# Patient Record
Sex: Male | Born: 1985 | Race: White | Hispanic: No | Marital: Single | Smoking: Former smoker
Health system: Southern US, Community
[De-identification: ages and names within clinical notes are randomized; demographics above are authoritative.]

## PROBLEM LIST (undated history)

## (undated) ENCOUNTER — Emergency Department (HOSPITAL_COMMUNITY): Admission: EM | Disposition: A | Discharge status: Home / Self Care | Payer: Medicare Other

## (undated) DIAGNOSIS — G40909 Epilepsy, unspecified, not intractable, without status epilepticus: Secondary | ICD-10-CM

---
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
## 2004-05-25 ENCOUNTER — Ambulatory Visit (HOSPITAL_COMMUNITY): Payer: Self-pay | Admitting: Psychiatry

## 2004-06-30 ENCOUNTER — Ambulatory Visit (HOSPITAL_COMMUNITY): Payer: Self-pay | Admitting: Psychiatry

## 2004-10-19 ENCOUNTER — Ambulatory Visit (HOSPITAL_COMMUNITY): Payer: Self-pay | Admitting: Psychiatry

## 2005-01-01 ENCOUNTER — Ambulatory Visit (HOSPITAL_COMMUNITY): Payer: Self-pay | Admitting: Psychiatry

## 2005-03-28 ENCOUNTER — Ambulatory Visit (HOSPITAL_COMMUNITY): Payer: Self-pay | Admitting: Psychiatry

## 2005-07-16 ENCOUNTER — Ambulatory Visit (HOSPITAL_COMMUNITY): Payer: Self-pay | Admitting: Psychiatry

## 2005-10-16 ENCOUNTER — Ambulatory Visit (HOSPITAL_COMMUNITY): Payer: Self-pay | Admitting: Psychiatry

## 2006-01-07 ENCOUNTER — Ambulatory Visit (HOSPITAL_COMMUNITY): Payer: Self-pay | Admitting: Psychiatry

## 2006-04-10 ENCOUNTER — Ambulatory Visit (HOSPITAL_COMMUNITY): Payer: Self-pay | Admitting: Psychiatry

## 2006-08-23 ENCOUNTER — Ambulatory Visit (HOSPITAL_COMMUNITY): Payer: Self-pay | Admitting: Psychiatry

## 2006-11-25 ENCOUNTER — Ambulatory Visit (HOSPITAL_COMMUNITY): Payer: Self-pay | Admitting: Psychiatry

## 2007-06-16 ENCOUNTER — Emergency Department (HOSPITAL_COMMUNITY): Admission: EM | Admit: 2007-06-16 | Discharge: 2007-06-16 | Payer: Self-pay | Admitting: Emergency Medicine

## 2007-08-24 ENCOUNTER — Emergency Department (HOSPITAL_COMMUNITY): Admission: EM | Admit: 2007-08-24 | Discharge: 2007-08-24 | Payer: Self-pay | Admitting: Emergency Medicine

## 2009-05-21 ENCOUNTER — Emergency Department (HOSPITAL_COMMUNITY): Admission: EM | Admit: 2009-05-21 | Discharge: 2009-05-21 | Payer: Self-pay | Admitting: Emergency Medicine

## 2009-11-05 ENCOUNTER — Emergency Department (HOSPITAL_COMMUNITY): Admission: EM | Admit: 2009-11-05 | Discharge: 2009-11-06 | Payer: Self-pay | Admitting: Emergency Medicine

## 2010-12-22 LAB — URINALYSIS, ROUTINE W REFLEX MICROSCOPIC
Ketones, ur: NEGATIVE mg/dL
Nitrite: NEGATIVE
Specific Gravity, Urine: 1.007 (ref 1.005–1.030)

## 2010-12-22 LAB — DIFFERENTIAL
Basophils Relative: 1 % (ref 0–1)
Eosinophils Absolute: 0.1 10*3/uL (ref 0.0–0.7)
Eosinophils Relative: 1 % (ref 0–5)
Monocytes Absolute: 0.5 10*3/uL (ref 0.1–1.0)
Monocytes Relative: 6 % (ref 3–12)
Neutrophils Relative %: 64 % (ref 43–77)

## 2010-12-22 LAB — BASIC METABOLIC PANEL
Creatinine, Ser: 0.76 mg/dL (ref 0.4–1.5)
GFR calc non Af Amer: >60 mL/min (ref >60)
Glucose, Bld: 118 mg/dL — ABNORMAL HIGH (ref 70–99)
Sodium: 141 mEq/L (ref 135–145)

## 2010-12-22 LAB — RAPID URINE DRUG SCREEN, HOSP PERFORMED
Cocaine: NOT DETECTED
Opiates: NOT DETECTED

## 2010-12-22 LAB — CBC
HCT: 44.9 % (ref 39.0–52.0)
Platelets: 267 10*3/uL (ref 150–400)
RDW: 12.1 % (ref 11.5–15.5)
WBC: 8.4 10*3/uL (ref 4.0–10.5)

## 2010-12-22 LAB — GLUCOSE, CAPILLARY

## 2011-06-25 LAB — BASIC METABOLIC PANEL
CO2: 32
Calcium: 10.1
Creatinine, Ser: 0.79
GFR calc non Af Amer: >60
Glucose, Bld: 125 — ABNORMAL HIGH

## 2011-06-25 LAB — DIFFERENTIAL
Basophils Relative: 1
Lymphocytes Relative: 37
Lymphs Abs: 2.7
Monocytes Relative: 8
Neutrophils Relative %: 54

## 2011-06-25 LAB — CBC
Platelets: 251
RDW: 12.9

## 2011-06-28 LAB — RAPID URINE DRUG SCREEN, HOSP PERFORMED
Amphetamines: NOT DETECTED
Benzodiazepines: NOT DETECTED
Cocaine: NOT DETECTED
Opiates: NOT DETECTED

## 2011-06-28 LAB — BASIC METABOLIC PANEL
Calcium: 10.1
Creatinine, Ser: 0.89
GFR calc Af Amer: >60
GFR calc non Af Amer: >60
Glucose, Bld: 84

## 2011-06-28 LAB — CBC
Hemoglobin: 15.6
MCHC: 34.3
RDW: 13.1

## 2011-06-28 LAB — URINALYSIS, ROUTINE W REFLEX MICROSCOPIC
Bilirubin Urine: NEGATIVE
Hgb urine dipstick: NEGATIVE
Nitrite: NEGATIVE
Specific Gravity, Urine: 1.01
pH: 7.5

## 2011-06-28 LAB — VALPROIC ACID LEVEL: Valproic Acid Lvl: 96.5

## 2011-06-28 LAB — DIFFERENTIAL
Lymphocytes Relative: 47 — ABNORMAL HIGH
Lymphs Abs: 3.1
Neutro Abs: 2.8
Neutrophils Relative %: 44

## 2014-09-24 DIAGNOSIS — G40209 Localization-related (focal) (partial) symptomatic epilepsy and epileptic syndromes with complex partial seizures, not intractable, without status epilepticus: Secondary | ICD-10-CM | POA: Diagnosis not present

## 2014-09-24 DIAGNOSIS — F79 Unspecified intellectual disabilities: Secondary | ICD-10-CM | POA: Diagnosis not present

## 2014-09-24 DIAGNOSIS — G919 Hydrocephalus, unspecified: Secondary | ICD-10-CM | POA: Diagnosis not present

## 2014-09-29 DIAGNOSIS — M7022 Olecranon bursitis, left elbow: Secondary | ICD-10-CM | POA: Diagnosis not present

## 2014-09-29 DIAGNOSIS — Z79899 Other long term (current) drug therapy: Secondary | ICD-10-CM | POA: Diagnosis not present

## 2014-09-29 DIAGNOSIS — Z6829 Body mass index (BMI) 29.0-29.9, adult: Secondary | ICD-10-CM | POA: Diagnosis not present

## 2015-01-05 DIAGNOSIS — Z79899 Other long term (current) drug therapy: Secondary | ICD-10-CM | POA: Diagnosis not present

## 2015-03-29 DIAGNOSIS — G40209 Localization-related (focal) (partial) symptomatic epilepsy and epileptic syndromes with complex partial seizures, not intractable, without status epilepticus: Secondary | ICD-10-CM | POA: Diagnosis not present

## 2015-04-01 DIAGNOSIS — F79 Unspecified intellectual disabilities: Secondary | ICD-10-CM | POA: Diagnosis not present

## 2015-04-01 DIAGNOSIS — G40219 Localization-related (focal) (partial) symptomatic epilepsy and epileptic syndromes with complex partial seizures, intractable, without status epilepticus: Secondary | ICD-10-CM | POA: Diagnosis not present

## 2015-04-20 DIAGNOSIS — S39012A Strain of muscle, fascia and tendon of lower back, initial encounter: Secondary | ICD-10-CM | POA: Diagnosis not present

## 2015-06-13 DIAGNOSIS — G40909 Epilepsy, unspecified, not intractable, without status epilepticus: Secondary | ICD-10-CM | POA: Diagnosis not present

## 2015-07-05 DIAGNOSIS — G44311 Acute post-traumatic headache, intractable: Secondary | ICD-10-CM | POA: Diagnosis not present

## 2015-07-05 DIAGNOSIS — W1839XA Other fall on same level, initial encounter: Secondary | ICD-10-CM | POA: Diagnosis not present

## 2015-07-05 DIAGNOSIS — R569 Unspecified convulsions: Secondary | ICD-10-CM | POA: Diagnosis not present

## 2015-07-05 DIAGNOSIS — Z79899 Other long term (current) drug therapy: Secondary | ICD-10-CM | POA: Diagnosis not present

## 2015-07-05 DIAGNOSIS — Q031 Atresia of foramina of Magendie and Luschka: Secondary | ICD-10-CM | POA: Diagnosis not present

## 2015-07-05 DIAGNOSIS — S0101XA Laceration without foreign body of scalp, initial encounter: Secondary | ICD-10-CM | POA: Diagnosis not present

## 2015-07-05 DIAGNOSIS — Z7984 Long term (current) use of oral hypoglycemic drugs: Secondary | ICD-10-CM | POA: Diagnosis not present

## 2015-07-05 DIAGNOSIS — R220 Localized swelling, mass and lump, head: Secondary | ICD-10-CM | POA: Diagnosis not present

## 2015-07-05 DIAGNOSIS — Z87891 Personal history of nicotine dependence: Secondary | ICD-10-CM | POA: Diagnosis not present

## 2015-07-15 DIAGNOSIS — T148 Other injury of unspecified body region: Secondary | ICD-10-CM | POA: Diagnosis not present

## 2015-07-15 DIAGNOSIS — Z23 Encounter for immunization: Secondary | ICD-10-CM | POA: Diagnosis not present

## 2015-07-15 DIAGNOSIS — F329 Major depressive disorder, single episode, unspecified: Secondary | ICD-10-CM | POA: Diagnosis not present

## 2015-07-15 DIAGNOSIS — Z79899 Other long term (current) drug therapy: Secondary | ICD-10-CM | POA: Diagnosis not present

## 2015-07-15 DIAGNOSIS — Z79891 Long term (current) use of opiate analgesic: Secondary | ICD-10-CM | POA: Diagnosis not present

## 2015-07-15 DIAGNOSIS — G40909 Epilepsy, unspecified, not intractable, without status epilepticus: Secondary | ICD-10-CM | POA: Diagnosis not present

## 2015-07-15 DIAGNOSIS — Z0000 Encounter for general adult medical examination without abnormal findings: Secondary | ICD-10-CM | POA: Diagnosis not present

## 2015-07-15 DIAGNOSIS — F79 Unspecified intellectual disabilities: Secondary | ICD-10-CM | POA: Diagnosis not present

## 2015-09-26 DIAGNOSIS — G40219 Localization-related (focal) (partial) symptomatic epilepsy and epileptic syndromes with complex partial seizures, intractable, without status epilepticus: Secondary | ICD-10-CM | POA: Diagnosis not present

## 2015-09-26 DIAGNOSIS — F79 Unspecified intellectual disabilities: Secondary | ICD-10-CM | POA: Diagnosis not present

## 2015-10-07 DIAGNOSIS — F79 Unspecified intellectual disabilities: Secondary | ICD-10-CM | POA: Diagnosis not present

## 2015-10-07 DIAGNOSIS — G40219 Localization-related (focal) (partial) symptomatic epilepsy and epileptic syndromes with complex partial seizures, intractable, without status epilepticus: Secondary | ICD-10-CM | POA: Diagnosis not present

## 2015-10-07 DIAGNOSIS — G919 Hydrocephalus, unspecified: Secondary | ICD-10-CM | POA: Diagnosis not present

## 2015-10-26 DIAGNOSIS — F840 Autistic disorder: Secondary | ICD-10-CM | POA: Diagnosis not present

## 2016-01-09 DIAGNOSIS — F840 Autistic disorder: Secondary | ICD-10-CM | POA: Diagnosis not present

## 2016-02-10 DIAGNOSIS — G919 Hydrocephalus, unspecified: Secondary | ICD-10-CM | POA: Diagnosis not present

## 2016-02-10 DIAGNOSIS — G40219 Localization-related (focal) (partial) symptomatic epilepsy and epileptic syndromes with complex partial seizures, intractable, without status epilepticus: Secondary | ICD-10-CM | POA: Diagnosis not present

## 2016-02-10 DIAGNOSIS — F79 Unspecified intellectual disabilities: Secondary | ICD-10-CM | POA: Diagnosis not present

## 2016-02-14 DIAGNOSIS — Z79899 Other long term (current) drug therapy: Secondary | ICD-10-CM | POA: Diagnosis not present

## 2016-02-14 DIAGNOSIS — R569 Unspecified convulsions: Secondary | ICD-10-CM | POA: Diagnosis not present

## 2016-02-14 DIAGNOSIS — W010XXA Fall on same level from slipping, tripping and stumbling without subsequent striking against object, initial encounter: Secondary | ICD-10-CM | POA: Diagnosis not present

## 2016-02-14 DIAGNOSIS — Z87891 Personal history of nicotine dependence: Secondary | ICD-10-CM | POA: Diagnosis not present

## 2016-02-14 DIAGNOSIS — F840 Autistic disorder: Secondary | ICD-10-CM | POA: Diagnosis not present

## 2016-02-14 DIAGNOSIS — S0121XA Laceration without foreign body of nose, initial encounter: Secondary | ICD-10-CM | POA: Diagnosis not present

## 2016-02-14 DIAGNOSIS — Z7984 Long term (current) use of oral hypoglycemic drugs: Secondary | ICD-10-CM | POA: Diagnosis not present

## 2016-03-21 DIAGNOSIS — R7303 Prediabetes: Secondary | ICD-10-CM | POA: Diagnosis not present

## 2016-04-10 DIAGNOSIS — F840 Autistic disorder: Secondary | ICD-10-CM | POA: Diagnosis not present

## 2016-04-13 DIAGNOSIS — R899 Unspecified abnormal finding in specimens from other organs, systems and tissues: Secondary | ICD-10-CM | POA: Diagnosis not present

## 2016-06-20 DIAGNOSIS — Z87891 Personal history of nicotine dependence: Secondary | ICD-10-CM | POA: Diagnosis not present

## 2016-06-20 DIAGNOSIS — Z7984 Long term (current) use of oral hypoglycemic drugs: Secondary | ICD-10-CM | POA: Diagnosis not present

## 2016-06-20 DIAGNOSIS — R569 Unspecified convulsions: Secondary | ICD-10-CM | POA: Diagnosis not present

## 2016-06-20 DIAGNOSIS — W182XXA Fall in (into) shower or empty bathtub, initial encounter: Secondary | ICD-10-CM | POA: Diagnosis not present

## 2016-06-20 DIAGNOSIS — Z79899 Other long term (current) drug therapy: Secondary | ICD-10-CM | POA: Diagnosis not present

## 2016-06-20 DIAGNOSIS — S0101XA Laceration without foreign body of scalp, initial encounter: Secondary | ICD-10-CM | POA: Diagnosis not present

## 2016-06-20 DIAGNOSIS — S0990XA Unspecified injury of head, initial encounter: Secondary | ICD-10-CM | POA: Diagnosis not present

## 2016-07-19 DIAGNOSIS — R7303 Prediabetes: Secondary | ICD-10-CM | POA: Diagnosis not present

## 2016-08-06 DIAGNOSIS — G919 Hydrocephalus, unspecified: Secondary | ICD-10-CM | POA: Diagnosis not present

## 2016-09-14 DIAGNOSIS — Z7984 Long term (current) use of oral hypoglycemic drugs: Secondary | ICD-10-CM | POA: Diagnosis not present

## 2016-09-14 DIAGNOSIS — Q039 Congenital hydrocephalus, unspecified: Secondary | ICD-10-CM | POA: Diagnosis not present

## 2016-09-14 DIAGNOSIS — Z87891 Personal history of nicotine dependence: Secondary | ICD-10-CM | POA: Diagnosis not present

## 2016-09-14 DIAGNOSIS — Z79899 Other long term (current) drug therapy: Secondary | ICD-10-CM | POA: Diagnosis not present

## 2016-09-14 DIAGNOSIS — S01311A Laceration without foreign body of right ear, initial encounter: Secondary | ICD-10-CM | POA: Diagnosis not present

## 2016-09-20 DIAGNOSIS — S0003XA Contusion of scalp, initial encounter: Secondary | ICD-10-CM | POA: Diagnosis not present

## 2016-09-20 DIAGNOSIS — M545 Low back pain: Secondary | ICD-10-CM | POA: Diagnosis not present

## 2016-09-20 DIAGNOSIS — S01511A Laceration without foreign body of lip, initial encounter: Secondary | ICD-10-CM | POA: Diagnosis not present

## 2016-09-25 DIAGNOSIS — S01309A Unspecified open wound of unspecified ear, initial encounter: Secondary | ICD-10-CM | POA: Diagnosis not present

## 2016-12-26 ENCOUNTER — Telehealth: Payer: Self-pay

## 2016-12-26 NOTE — Telephone Encounter (Signed)
The home number listed is the [facility]), and Yvone Neu stated that he isn't one of their consumers. [place] ([facility])

## 2016-12-26 NOTE — Telephone Encounter (Signed)
I called the home number listed to confirm if patient has a PCP.  I spoke with [place] who stated that they are a day program for mental health patients. [place] ([facility])

## 2017-01-10 DIAGNOSIS — R7303 Prediabetes: Secondary | ICD-10-CM | POA: Diagnosis not present

## 2017-03-25 DIAGNOSIS — Z0000 Encounter for general adult medical examination without abnormal findings: Secondary | ICD-10-CM | POA: Diagnosis not present

## 2017-05-09 ENCOUNTER — Telehealth: Payer: Self-pay

## 2017-05-09 NOTE — Telephone Encounter (Signed)
Patient on quality measure schedule visit list. No answer on patient phone number, unable to leave message. No pcp, not a patient of [employer], never seen.

## 2017-06-28 DIAGNOSIS — R7303 Prediabetes: Secondary | ICD-10-CM | POA: Diagnosis not present

## 2017-12-17 DIAGNOSIS — R7303 Prediabetes: Secondary | ICD-10-CM | POA: Diagnosis not present

## 2018-05-24 DIAGNOSIS — Z79899 Other long term (current) drug therapy: Secondary | ICD-10-CM | POA: Diagnosis not present

## 2018-05-24 DIAGNOSIS — Z87891 Personal history of nicotine dependence: Secondary | ICD-10-CM | POA: Diagnosis not present

## 2018-05-24 DIAGNOSIS — Z041 Encounter for examination and observation following transport accident: Secondary | ICD-10-CM | POA: Diagnosis not present

## 2018-05-24 DIAGNOSIS — Z7984 Long term (current) use of oral hypoglycemic drugs: Secondary | ICD-10-CM | POA: Diagnosis not present

## 2018-06-06 DIAGNOSIS — R0602 Shortness of breath: Secondary | ICD-10-CM | POA: Diagnosis not present

## 2018-06-06 DIAGNOSIS — Z0000 Encounter for general adult medical examination without abnormal findings: Secondary | ICD-10-CM | POA: Diagnosis not present

## 2018-06-06 DIAGNOSIS — Z79899 Other long term (current) drug therapy: Secondary | ICD-10-CM | POA: Diagnosis not present

## 2018-06-06 DIAGNOSIS — Z131 Encounter for screening for diabetes mellitus: Secondary | ICD-10-CM | POA: Diagnosis not present

## 2018-06-06 DIAGNOSIS — E7800 Pure hypercholesterolemia, unspecified: Secondary | ICD-10-CM | POA: Diagnosis not present

## 2018-06-06 DIAGNOSIS — R5383 Other fatigue: Secondary | ICD-10-CM | POA: Diagnosis not present

## 2018-06-06 DIAGNOSIS — E559 Vitamin D deficiency, unspecified: Secondary | ICD-10-CM | POA: Diagnosis not present

## 2018-06-06 DIAGNOSIS — Z23 Encounter for immunization: Secondary | ICD-10-CM | POA: Diagnosis not present

## 2018-06-06 DIAGNOSIS — Z13220 Encounter for screening for lipoid disorders: Secondary | ICD-10-CM | POA: Diagnosis not present

## 2018-09-16 DIAGNOSIS — E559 Vitamin D deficiency, unspecified: Secondary | ICD-10-CM | POA: Diagnosis not present

## 2018-09-16 DIAGNOSIS — R5383 Other fatigue: Secondary | ICD-10-CM | POA: Diagnosis not present

## 2018-09-16 DIAGNOSIS — R7303 Prediabetes: Secondary | ICD-10-CM | POA: Diagnosis not present

## 2018-09-16 DIAGNOSIS — Z79899 Other long term (current) drug therapy: Secondary | ICD-10-CM | POA: Diagnosis not present

## 2019-01-07 DIAGNOSIS — E559 Vitamin D deficiency, unspecified: Secondary | ICD-10-CM | POA: Diagnosis not present

## 2019-01-07 DIAGNOSIS — R7303 Prediabetes: Secondary | ICD-10-CM | POA: Diagnosis not present

## 2019-01-28 DIAGNOSIS — Z87891 Personal history of nicotine dependence: Secondary | ICD-10-CM | POA: Diagnosis not present

## 2019-01-28 DIAGNOSIS — Z79899 Other long term (current) drug therapy: Secondary | ICD-10-CM | POA: Diagnosis not present

## 2019-01-28 DIAGNOSIS — G8911 Acute pain due to trauma: Secondary | ICD-10-CM | POA: Diagnosis not present

## 2019-01-28 DIAGNOSIS — S0181XA Laceration without foreign body of other part of head, initial encounter: Secondary | ICD-10-CM | POA: Diagnosis not present

## 2019-03-05 DIAGNOSIS — S0181XA Laceration without foreign body of other part of head, initial encounter: Secondary | ICD-10-CM | POA: Diagnosis not present

## 2019-03-05 DIAGNOSIS — Z7984 Long term (current) use of oral hypoglycemic drugs: Secondary | ICD-10-CM | POA: Diagnosis not present

## 2019-03-05 DIAGNOSIS — Z79899 Other long term (current) drug therapy: Secondary | ICD-10-CM | POA: Diagnosis not present

## 2019-03-05 DIAGNOSIS — Z87891 Personal history of nicotine dependence: Secondary | ICD-10-CM | POA: Diagnosis not present

## 2019-04-15 DIAGNOSIS — E7800 Pure hypercholesterolemia, unspecified: Secondary | ICD-10-CM | POA: Diagnosis not present

## 2019-04-15 DIAGNOSIS — E559 Vitamin D deficiency, unspecified: Secondary | ICD-10-CM | POA: Diagnosis not present

## 2019-04-15 DIAGNOSIS — Z1159 Encounter for screening for other viral diseases: Secondary | ICD-10-CM | POA: Diagnosis not present

## 2019-04-15 DIAGNOSIS — R7303 Prediabetes: Secondary | ICD-10-CM | POA: Diagnosis not present

## 2019-06-24 DIAGNOSIS — Q031 Atresia of foramina of Magendie and Luschka: Secondary | ICD-10-CM | POA: Diagnosis not present

## 2019-07-01 DIAGNOSIS — S0990XA Unspecified injury of head, initial encounter: Secondary | ICD-10-CM | POA: Diagnosis not present

## 2019-07-06 ENCOUNTER — Emergency Department (HOSPITAL_COMMUNITY): Payer: Medicare Other

## 2019-07-06 ENCOUNTER — Encounter (HOSPITAL_COMMUNITY): Payer: Self-pay | Admitting: *Deleted

## 2019-07-06 ENCOUNTER — Emergency Department (HOSPITAL_COMMUNITY)
Admission: EM | Admit: 2019-07-06 | Discharge: 2019-07-06 | Disposition: A | Discharge status: Home / Self Care | Payer: Medicare Other | Attending: Emergency Medicine | Admitting: Emergency Medicine

## 2019-07-06 ENCOUNTER — Other Ambulatory Visit: Payer: Self-pay

## 2019-07-06 DIAGNOSIS — Z87891 Personal history of nicotine dependence: Secondary | ICD-10-CM | POA: Diagnosis not present

## 2019-07-06 DIAGNOSIS — S022XXB Fracture of nasal bones, initial encounter for open fracture: Secondary | ICD-10-CM | POA: Diagnosis not present

## 2019-07-06 DIAGNOSIS — S0083XA Contusion of other part of head, initial encounter: Secondary | ICD-10-CM | POA: Diagnosis not present

## 2019-07-06 DIAGNOSIS — W010XXA Fall on same level from slipping, tripping and stumbling without subsequent striking against object, initial encounter: Secondary | ICD-10-CM | POA: Diagnosis not present

## 2019-07-06 DIAGNOSIS — Z23 Encounter for immunization: Secondary | ICD-10-CM | POA: Insufficient documentation

## 2019-07-06 DIAGNOSIS — Y999 Unspecified external cause status: Secondary | ICD-10-CM | POA: Diagnosis not present

## 2019-07-06 DIAGNOSIS — S0081XA Abrasion of other part of head, initial encounter: Secondary | ICD-10-CM

## 2019-07-06 DIAGNOSIS — S0181XA Laceration without foreign body of other part of head, initial encounter: Secondary | ICD-10-CM | POA: Diagnosis not present

## 2019-07-06 DIAGNOSIS — Y9301 Activity, walking, marching and hiking: Secondary | ICD-10-CM | POA: Insufficient documentation

## 2019-07-06 DIAGNOSIS — R569 Unspecified convulsions: Secondary | ICD-10-CM | POA: Diagnosis not present

## 2019-07-06 DIAGNOSIS — Y929 Unspecified place or not applicable: Secondary | ICD-10-CM | POA: Insufficient documentation

## 2019-07-06 DIAGNOSIS — S0990XA Unspecified injury of head, initial encounter: Secondary | ICD-10-CM | POA: Diagnosis present

## 2019-07-06 DIAGNOSIS — S0219XA Other fracture of base of skull, initial encounter for closed fracture: Secondary | ICD-10-CM | POA: Diagnosis not present

## 2019-07-06 DIAGNOSIS — S0501XA Injury of conjunctiva and corneal abrasion without foreign body, right eye, initial encounter: Secondary | ICD-10-CM | POA: Diagnosis not present

## 2019-07-06 DIAGNOSIS — R000 Tachycardia, unspecified: Secondary | ICD-10-CM | POA: Diagnosis not present

## 2019-07-06 DIAGNOSIS — R52 Pain, unspecified: Secondary | ICD-10-CM | POA: Diagnosis not present

## 2019-07-06 DIAGNOSIS — S0219XB Other fracture of base of skull, initial encounter for open fracture: Secondary | ICD-10-CM | POA: Diagnosis not present

## 2019-07-06 DIAGNOSIS — S199XXA Unspecified injury of neck, initial encounter: Secondary | ICD-10-CM | POA: Diagnosis not present

## 2019-07-06 DIAGNOSIS — W19XXXA Unspecified fall, initial encounter: Secondary | ICD-10-CM | POA: Diagnosis not present

## 2019-07-06 HISTORY — DX: Epilepsy, unspecified, not intractable, without status epilepticus: G40.909

## 2019-07-06 MED ORDER — AMOXICILLIN-POT CLAVULANATE 875-125 MG PO TABS: 1.0000 | ORAL_TABLET | Freq: Two times a day (BID) | ORAL | 14 refills | Status: AC

## 2019-07-06 MED ORDER — LIDOCAINE-EPINEPHRINE (PF) 2 %-1:200000 IJ SOLN
20.0000 mL | Freq: Once | INTRAMUSCULAR | Status: AC
  Administered 2019-07-06: 20 mL
  Filled 2019-07-06: qty 20

## 2019-07-06 MED ORDER — AMOXICILLIN-POT CLAVULANATE 875-125 MG PO TABS: 1.0000 | ORAL_TABLET | Freq: Once | ORAL | Status: DC

## 2019-07-06 MED ORDER — TETANUS-DIPHTH-ACELL PERTUSSIS 5-2.5-18.5 LF-MCG/0.5 IM SUSP
0.5000 mL | Freq: Once | INTRAMUSCULAR | Status: AC
  Administered 2019-07-06: 0.5 mL via INTRAMUSCULAR
  Filled 2019-07-06: qty 0.5

## 2019-07-06 MED ORDER — ACETAMINOPHEN 500 MG PO TABS: 1000.0000 mg | ORAL_TABLET | Freq: Once | ORAL | Status: DC

## 2019-07-06 MED ORDER — BACITRACIN ZINC 500 UNIT/GM EX OINT: TOPICAL_OINTMENT | Freq: Once | CUTANEOUS | Status: DC

## 2019-07-06 NOTE — ED Notes (Signed)
Got patient on the monitor did ekg shown to Dr Ashok Cordia patient is resting with nurse at bedside

## 2019-07-06 NOTE — Discharge Instructions (Addendum)
It was our pleasure to provide your ER care today - we hope that you feel better.  Your ct scan shows sinus fractures. Elevate head of bed to help with pain/swelling. Icepack/cold to sore area. Keep abrasions/laceration very clean. Have sutures removed in 7-10 days. Fall precautions. Wear helmet (ensure that it doesn't rub on sutured laceration).  It is very important that you avoid blowing your nose until cleared to do so by maxillofacial specialist (blowing your nose could cause air to be forced into your eye/orbit area, and predispose you to complications as a result).    For facial/sinus fractures, you must follow up with maxillofacial specialist in the next 7-10 days - call office today to arrange appointment time.   Take antibiotic as prescribed (augmentin). You may take acetaminophen or ibuprofen as need for pain.  Return to ER if worse, new symptoms, fevers, change in mental status, persistent vomiting, new or severe pain, or other concern.

## 2019-07-06 NOTE — ED Triage Notes (Signed)
Pt with hx of seizures and developmental delay to ED after seizing while walking, falling and hitting head.  Per [facility], pt was back to norm, not post ictal.  Lac to R forehead, R lower lip.  Abrasion to R of R eye.

## 2019-07-06 NOTE — ED Notes (Signed)
MD at bedside suturing pt.

## 2019-07-06 NOTE — ED Provider Notes (Addendum)
[facility] EMERGENCY DEPARTMENT Provider Note   CSN: [id number] Arrival date & time: 07/06/19  1037     History   Chief Complaint Chief Complaint  Patient presents with   Seizures    HPI JOBIE POPP is a 33 y.o. male.     Patient with hx seizures, DD, congenital hydrocephalus, presents s/p fall. Caregiver states was walking on uneven surface/hill, and fell on concrete. Hit head. No loc. ?dazed. Contusion/laceration to forehead, contusions to face. Patient had his helmet on (wears for seizures), but 'it was shifted' on head, allowing contusion/injury to head and face area. Denies other recent fall, although does have hx falls. Tetanus unknown. Denies other pain or injury. History seizures, and has periodic brief seizures despite meds - no recent change or increase in seizures. No fevers. No neck or back pain. Denies numbness/weakness.   The history is provided by the patient, a caregiver and the EMS personnel. The history is limited by the condition of the patient.  Seizures   Past Medical History:  Diagnosis Date   Epilepsy (HCC)     There are no active problems to display for this patient.   History reviewed. No pertinent surgical history.      Home Medications    Prior to Admission medications   Not on File    Family History No family history on file.  Social History Social History   Tobacco Use   Smoking status: Former Smoker   Smokeless tobacco: Never Used  Substance Use Topics   Alcohol use: Never    Frequency: Never   Drug use: Never     Allergies   Other   Review of Systems Review of Systems  Constitutional: Negative for fever.  HENT: Negative for sore throat.   Eyes: Negative for pain.  Respiratory: Negative for shortness of breath.   Cardiovascular: Negative for chest pain.  Gastrointestinal: Negative for abdominal pain and vomiting.  Genitourinary: Negative for flank pain.  Musculoskeletal:  Negative for back pain and neck pain.  Skin: Positive for wound.  Neurological: Positive for seizures. Negative for weakness, numbness and headaches.  Hematological: Does not bruise/bleed easily.  Psychiatric/Behavioral: Negative for confusion.     Physical Exam Updated Vital Signs BP (!) 142/89 (BP Location: Right Arm)    Pulse 93    Temp 98.7 F (37.1 C)    Resp (!) 30    Ht 1.803 m (5\' 11" )    Wt 72.6 kg    SpO2 98%    BMI 22.32 kg/m   Physical Exam Vitals signs and nursing note reviewed.  Constitutional:      Appearance: Normal appearance. He is well-developed.  HENT:     Head:     Comments: Contusions to face and forehead. 4 cm laceration to right forehead. Tenderness right zygoma/cheek area. Facial bones/orbits grossly intact with no obvious deformity noted. No malocclusion. Teeth firmly intact. No nasal septal hematoma.     Nose: Nose normal.     Mouth/Throat:     Mouth: Mucous membranes are moist.     Pharynx: Oropharynx is clear.  Eyes:     General: No scleral icterus.    Extraocular Movements: Extraocular movements intact.     Conjunctiva/sclera: Conjunctivae normal.     Pupils: Pupils are equal, round, and reactive to light.  Neck:     Musculoskeletal: Normal range of motion and neck supple. No neck rigidity.     Trachea: No tracheal deviation.  Cardiovascular:  Rate and Rhythm: Normal rate and regular rhythm.     Pulses: Normal pulses.     Heart sounds: Normal heart sounds. No murmur. No friction rub. No gallop.   Pulmonary:     Effort: Pulmonary effort is normal. No accessory muscle usage or respiratory distress.     Breath sounds: Normal breath sounds.  Chest:     Chest wall: No tenderness.  Abdominal:     General: Bowel sounds are normal. There is no distension.     Palpations: Abdomen is soft.     Tenderness: There is no abdominal tenderness. There is no guarding.     Comments: No abd bruising, contusion or tenderness.   Genitourinary:    Comments:  No cva tenderness. Musculoskeletal:        General: No swelling.     Comments: CTLS spine, non tender, aligned, no step off. Good rom bil ext without pain or focal bony tenderness.   Skin:    General: Skin is warm and dry.     Findings: No rash.  Neurological:     Mental Status: He is alert.     Comments: Alert, speech clear. pts mental status described as being c/w baseline. Motor intact bil, ster 5/5. sens grossly intact.   Psychiatric:        Mood and Affect: Mood normal.      ED Treatments / Results  Labs (all labs ordered are listed, but only abnormal results are displayed) Labs Reviewed - No data to display  EKG EKG Interpretation  Date/Time:  Monday July 06 2019 10:56:24 EDT Ventricular Rate:  94 PR Interval:    QRS Duration: 110 QT Interval:  359 QTC Calculation: 449 R Axis:   72 Text Interpretation:  Sinus rhythm No significant change since last tracing Confirmed by Cathren Laine ([id number]) on 07/06/2019 11:00:03 AM   Radiology Ct Head Wo Contrast  Result Date: 07/06/2019 CLINICAL DATA:  Recent seizure activity with fall, initial encounter EXAM: CT HEAD WITHOUT CONTRAST CT MAXILLOFACIAL WITHOUT CONTRAST CT CERVICAL SPINE WITHOUT CONTRAST TECHNIQUE: Multidetector CT imaging of the head, cervical spine, and maxillofacial structures were performed using the standard protocol without intravenous contrast. Multiplanar CT image reconstructions of the cervical spine and maxillofacial structures were also generated. COMPARISON:  11/06/2009 FINDINGS: CT HEAD FINDINGS Brain: Stable dilatation of the right lateral ventricle and the region of the cisterna magna is noted with posterior defect in the cerebellum. These changes are stable from the prior exam. No findings to suggest acute hemorrhage, acute infarction or space-occupying mass lesion noted. Vascular: No hyperdense vessel or unexpected calcification. Skull: There is a minimally depressed fracture involving the anterior  wall of the right frontal sinus in the area of clinical concern. Opacification of the right frontal and ethmoid sinuses is noted. Other: Mild laceration in the right frontal region is noted consistent with the recent injury. CT MAXILLOFACIAL FINDINGS Osseous: Previously described fracture in the anterior aspect of the right frontal sinus is noted extending into the superior aspect of the right orbital wall laterally. Minimal intraorbital air is noted. No other fracture is noted. Orbits: Orbits and their contents are otherwise within normal limits. Sinuses: Paranasal sinuses demonstrate opacification of the right ethmoid and frontal sinuses related to the underlying fracture. Ostiomeatal complexes are patent bilaterally. Soft tissues: Mild soft tissue swelling is noted in the region of the right frontal laceration. No other soft tissue abnormality is noted. CT CERVICAL SPINE FINDINGS Alignment: Within normal limits. Skull base and vertebrae: 7  cervical segments are well visualized. Vertebral body height is well maintained. Mild osteophytic changes are noted at C5-6 and C6-7. No acute fracture or acute facet abnormality is noted. Soft tissues and spinal canal: Surrounding soft tissue structures are within normal limits. Upper chest: Visualized lung apices are unremarkable. Other: None IMPRESSION: CT of the head: Chronic changes similar to that seen on the prior exam. No acute abnormality noted. CT of the maxillofacial bones: Fracture in the anterior wall of the right frontal sinus extending into the right ethmoid sinus and superior wall of the right orbit. Minimal air within the right orbit is Noted. Fluid/blood is seen within the right frontal and ethmoid sinuses related to the fracture. CT of the cervical spine: Degenerative change without acute abnormality. Electronically Signed   By: Alcide CleverMark  Lukens M.D.   On: 07/06/2019 12:16   Ct Cervical Spine Wo Contrast  Result Date: 07/06/2019 CLINICAL DATA:  Recent  seizure activity with fall, initial encounter EXAM: CT HEAD WITHOUT CONTRAST CT MAXILLOFACIAL WITHOUT CONTRAST CT CERVICAL SPINE WITHOUT CONTRAST TECHNIQUE: Multidetector CT imaging of the head, cervical spine, and maxillofacial structures were performed using the standard protocol without intravenous contrast. Multiplanar CT image reconstructions of the cervical spine and maxillofacial structures were also generated. COMPARISON:  11/06/2009 FINDINGS: CT HEAD FINDINGS Brain: Stable dilatation of the right lateral ventricle and the region of the cisterna magna is noted with posterior defect in the cerebellum. These changes are stable from the prior exam. No findings to suggest acute hemorrhage, acute infarction or space-occupying mass lesion noted. Vascular: No hyperdense vessel or unexpected calcification. Skull: There is a minimally depressed fracture involving the anterior wall of the right frontal sinus in the area of clinical concern. Opacification of the right frontal and ethmoid sinuses is noted. Other: Mild laceration in the right frontal region is noted consistent with the recent injury. CT MAXILLOFACIAL FINDINGS Osseous: Previously described fracture in the anterior aspect of the right frontal sinus is noted extending into the superior aspect of the right orbital wall laterally. Minimal intraorbital air is noted. No other fracture is noted. Orbits: Orbits and their contents are otherwise within normal limits. Sinuses: Paranasal sinuses demonstrate opacification of the right ethmoid and frontal sinuses related to the underlying fracture. Ostiomeatal complexes are patent bilaterally. Soft tissues: Mild soft tissue swelling is noted in the region of the right frontal laceration. No other soft tissue abnormality is noted. CT CERVICAL SPINE FINDINGS Alignment: Within normal limits. Skull base and vertebrae: 7 cervical segments are well visualized. Vertebral body height is well maintained. Mild osteophytic  changes are noted at C5-6 and C6-7. No acute fracture or acute facet abnormality is noted. Soft tissues and spinal canal: Surrounding soft tissue structures are within normal limits. Upper chest: Visualized lung apices are unremarkable. Other: None IMPRESSION: CT of the head: Chronic changes similar to that seen on the prior exam. No acute abnormality noted. CT of the maxillofacial bones: Fracture in the anterior wall of the right frontal sinus extending into the right ethmoid sinus and superior wall of the right orbit. Minimal air within the right orbit is Noted. Fluid/blood is seen within the right frontal and ethmoid sinuses related to the fracture. CT of the cervical spine: Degenerative change without acute abnormality. Electronically Signed   By: Alcide CleverMark  Lukens M.D.   On: 07/06/2019 12:16   Ct Maxillofacial Wo Contrast  Result Date: 07/06/2019 CLINICAL DATA:  Recent seizure activity with fall, initial encounter EXAM: CT HEAD WITHOUT CONTRAST CT MAXILLOFACIAL WITHOUT  CONTRAST CT CERVICAL SPINE WITHOUT CONTRAST TECHNIQUE: Multidetector CT imaging of the head, cervical spine, and maxillofacial structures were performed using the standard protocol without intravenous contrast. Multiplanar CT image reconstructions of the cervical spine and maxillofacial structures were also generated. COMPARISON:  11/06/2009 FINDINGS: CT HEAD FINDINGS Brain: Stable dilatation of the right lateral ventricle and the region of the cisterna magna is noted with posterior defect in the cerebellum. These changes are stable from the prior exam. No findings to suggest acute hemorrhage, acute infarction or space-occupying mass lesion noted. Vascular: No hyperdense vessel or unexpected calcification. Skull: There is a minimally depressed fracture involving the anterior wall of the right frontal sinus in the area of clinical concern. Opacification of the right frontal and ethmoid sinuses is noted. Other: Mild laceration in the right frontal  region is noted consistent with the recent injury. CT MAXILLOFACIAL FINDINGS Osseous: Previously described fracture in the anterior aspect of the right frontal sinus is noted extending into the superior aspect of the right orbital wall laterally. Minimal intraorbital air is noted. No other fracture is noted. Orbits: Orbits and their contents are otherwise within normal limits. Sinuses: Paranasal sinuses demonstrate opacification of the right ethmoid and frontal sinuses related to the underlying fracture. Ostiomeatal complexes are patent bilaterally. Soft tissues: Mild soft tissue swelling is noted in the region of the right frontal laceration. No other soft tissue abnormality is noted. CT CERVICAL SPINE FINDINGS Alignment: Within normal limits. Skull base and vertebrae: 7 cervical segments are well visualized. Vertebral body height is well maintained. Mild osteophytic changes are noted at C5-6 and C6-7. No acute fracture or acute facet abnormality is noted. Soft tissues and spinal canal: Surrounding soft tissue structures are within normal limits. Upper chest: Visualized lung apices are unremarkable. Other: None IMPRESSION: CT of the head: Chronic changes similar to that seen on the prior exam. No acute abnormality noted. CT of the maxillofacial bones: Fracture in the anterior wall of the right frontal sinus extending into the right ethmoid sinus and superior wall of the right orbit. Minimal air within the right orbit is Noted. Fluid/blood is seen within the right frontal and ethmoid sinuses related to the fracture. CT of the cervical spine: Degenerative change without acute abnormality. Electronically Signed   By: Inez Catalina M.D.   On: 07/06/2019 12:16    Procedures .Marland KitchenLaceration Repair  Date/Time: 07/06/2019 12:59 PM Performed by: Lajean Saver, MD Authorized by: Lajean Saver, MD   Consent:    Consent given by:  Patient Anesthesia (see MAR for exact dosages):    Anesthesia method:  Local  infiltration   Local anesthetic:  Lidocaine 2% WITH epi Laceration details:    Location:  Face   Face location:  Forehead   Length (cm):  4 Repair type:    Repair type:  Intermediate Pre-procedure details:    Preparation:  Patient was prepped and draped in usual sterile fashion and imaging obtained to evaluate for foreign bodies Exploration:    Wound exploration: entire depth of wound probed and visualized   Treatment:    Area cleansed with:  Betadine   Irrigation solution:  Sterile water   Irrigation volume:  500 cc   Irrigation method:  Syringe Subcutaneous repair:    Suture size:  5-0   Suture material:  Vicryl   Number of sutures:  3 Skin repair:    Repair method:  Sutures   Suture size:  6-0   Suture material:  Prolene   Suture technique:  Simple  interrupted   Number of sutures:  9 Post-procedure details:    Dressing:  Antibiotic ointment   Patient tolerance of procedure:  Tolerated well, no immediate complications   (including critical care time)  Medications Ordered in ED Medications  Tdap (BOOSTRIX) injection 0.5 mL (has no administration in time range)  lidocaine-EPINEPHrine (XYLOCAINE W/EPI) 2 %-1:200000 (PF) injection 20 mL (has no administration in time range)     Initial Impression / Assessment and Plan / ED Course  I have reviewed the triage vital signs and the nursing notes.  Pertinent labs & imaging results that were available during my care of the patient were reviewed by me and considered in my medical decision making (see chart for details).  Imaging studies ordered.   Reviewed nursing notes and prior charts for additional history.   Tetanus im.   Wound sutured.   CTs reviewed by me - frontal sinus fxs/orbit fx. No visual complaints, eomi.  Maxillofacial on call consulted - discussed w Dr Kenney Houseman who reviewed ct findings - he indicates avoid blowing nose, f/u office 7-10 days.   Augmentin. Po.  Acetaminophen po.   Po fluids.   Recheck spine  non tender. No new c/o. No nv.     Final Clinical Impressions(s) / ED Diagnoses   Final diagnoses:  None    ED Discharge Orders    None         Cathren Laine, MD 07/06/19 1346

## 2019-07-08 DIAGNOSIS — Q031 Atresia of foramina of Magendie and Luschka: Secondary | ICD-10-CM | POA: Diagnosis not present

## 2019-07-08 DIAGNOSIS — S1081XD Abrasion of other specified part of neck, subsequent encounter: Secondary | ICD-10-CM | POA: Diagnosis not present

## 2019-07-08 DIAGNOSIS — S0181XD Laceration without foreign body of other part of head, subsequent encounter: Secondary | ICD-10-CM | POA: Diagnosis not present

## 2019-07-08 DIAGNOSIS — E119 Type 2 diabetes mellitus without complications: Secondary | ICD-10-CM | POA: Diagnosis not present

## 2019-07-15 DIAGNOSIS — S0181XD Laceration without foreign body of other part of head, subsequent encounter: Secondary | ICD-10-CM | POA: Diagnosis not present

## 2019-07-15 DIAGNOSIS — Z4802 Encounter for removal of sutures: Secondary | ICD-10-CM | POA: Diagnosis not present

## 2019-09-15 DIAGNOSIS — S93511A Sprain of interphalangeal joint of right great toe, initial encounter: Secondary | ICD-10-CM | POA: Diagnosis not present

## 2019-12-14 DIAGNOSIS — E119 Type 2 diabetes mellitus without complications: Secondary | ICD-10-CM | POA: Diagnosis not present

## 2019-12-14 DIAGNOSIS — Q031 Atresia of foramina of Magendie and Luschka: Secondary | ICD-10-CM | POA: Diagnosis not present

## 2020-01-08 ENCOUNTER — Emergency Department (HOSPITAL_COMMUNITY)
Admission: EM | Admit: 2020-01-08 | Discharge: 2020-01-08 | Disposition: A | Discharge status: Home / Self Care | Payer: Medicare Other | Attending: Emergency Medicine | Admitting: Emergency Medicine

## 2020-01-08 ENCOUNTER — Other Ambulatory Visit: Payer: Self-pay

## 2020-01-08 ENCOUNTER — Emergency Department (HOSPITAL_COMMUNITY): Payer: Medicare Other

## 2020-01-08 ENCOUNTER — Encounter (HOSPITAL_COMMUNITY): Payer: Self-pay

## 2020-01-08 DIAGNOSIS — X58XXXA Exposure to other specified factors, initial encounter: Secondary | ICD-10-CM | POA: Insufficient documentation

## 2020-01-08 DIAGNOSIS — S0181XA Laceration without foreign body of other part of head, initial encounter: Secondary | ICD-10-CM | POA: Insufficient documentation

## 2020-01-08 DIAGNOSIS — Z87891 Personal history of nicotine dependence: Secondary | ICD-10-CM | POA: Diagnosis not present

## 2020-01-08 DIAGNOSIS — Y929 Unspecified place or not applicable: Secondary | ICD-10-CM | POA: Insufficient documentation

## 2020-01-08 DIAGNOSIS — R569 Unspecified convulsions: Secondary | ICD-10-CM | POA: Diagnosis not present

## 2020-01-08 DIAGNOSIS — S0993XA Unspecified injury of face, initial encounter: Secondary | ICD-10-CM | POA: Diagnosis not present

## 2020-01-08 DIAGNOSIS — Y939 Activity, unspecified: Secondary | ICD-10-CM | POA: Insufficient documentation

## 2020-01-08 DIAGNOSIS — Z79899 Other long term (current) drug therapy: Secondary | ICD-10-CM | POA: Diagnosis not present

## 2020-01-08 DIAGNOSIS — Y999 Unspecified external cause status: Secondary | ICD-10-CM | POA: Insufficient documentation

## 2020-01-08 DIAGNOSIS — S01412A Laceration without foreign body of left cheek and temporomandibular area, initial encounter: Secondary | ICD-10-CM | POA: Diagnosis not present

## 2020-01-08 LAB — BASIC METABOLIC PANEL
Anion gap: 13 (ref 5–15)
BUN: 13 mg/dL (ref 6–20)
CO2: 22 mmol/L (ref 22–32)
Calcium: 9.8 mg/dL (ref 8.9–10.3)
Chloride: 102 mmol/L (ref 98–111)
Creatinine, Ser: 0.98 mg/dL (ref 0.61–1.24)
GFR calc Af Amer: >60 mL/min (ref >60)
GFR calc non Af Amer: >60 mL/min (ref >60)
Glucose, Bld: 151 mg/dL — ABNORMAL HIGH (ref 70–99)
Potassium: 4.1 mmol/L (ref 3.5–5.1)
Sodium: 137 mmol/L (ref 135–145)

## 2020-01-08 LAB — CBC
HCT: 45.1 % (ref 39.0–52.0)
Hemoglobin: 15 g/dL (ref 13.0–17.0)
MCH: 30 pg (ref 26.0–34.0)
MCHC: 33.3 g/dL (ref 30.0–36.0)
MCV: 90.2 fL (ref 80.0–100.0)
Platelets: 307 10*3/uL (ref 150–400)
RBC: 5 MIL/uL (ref 4.22–5.81)
RDW: 11.9 % (ref 11.5–15.5)
WBC: 7 10*3/uL (ref 4.0–10.5)
nRBC: 0 % (ref 0.0–0.2)

## 2020-01-08 MED ORDER — LIDOCAINE HCL 2 % IJ SOLN
10.0000 mL | Freq: Once | INTRAMUSCULAR | Status: AC
  Administered 2020-01-08: 13:00:00 200 mg
  Filled 2020-01-08: qty 20

## 2020-01-08 MED ORDER — LIDOCAINE-EPINEPHRINE-TETRACAINE (LET) TOPICAL GEL
3.0000 mL | Freq: Once | TOPICAL | Status: AC
  Administered 2020-01-08: 11:00:00 3 mL via TOPICAL
  Filled 2020-01-08: qty 3

## 2020-01-08 MED ORDER — SODIUM BICARBONATE 4 % IV SOLN
5.0000 mL | Freq: Once | INTRAVENOUS | Status: AC
  Administered 2020-01-08: 5 mL via SUBCUTANEOUS
  Filled 2020-01-08: qty 5

## 2020-01-08 NOTE — Discharge Instructions (Addendum)
Have the stitches taken out in 5 to 7 days.

## 2020-01-08 NOTE — ED Provider Notes (Signed)
[facility] EMERGENCY DEPARTMENT Provider Note   CSN: [id number] Arrival date & time: 01/08/20  7829     History Chief Complaint  Patient presents with  . Seizures    Austin Hayes is a 34 y.o. male.  HPI Patient presents after seizure.  History of multiple seizures.  Comes from a group home.  Does have some developmental delay.  Last seizure was 3 days ago.  Seizures are usual for him.Marland Kitchen  Here because he has had a laceration of the left face.  He is back at baseline.  No recent change in medications.  Last tetanus is likely within the last 5 years per caregiver.  He is not on blood thinners.    Past Medical History:  Diagnosis Date  . Epilepsy (HCC)     There are no problems to display for this patient.   History reviewed. No pertinent surgical history.     History reviewed. No pertinent family history.  Social History   Tobacco Use  . Smoking status: Former Games developer  . Smokeless tobacco: Never Used  Substance Use Topics  . Alcohol use: Never  . Drug use: Never    Home Medications Prior to Admission medications   Medication Sig Start Date End Date Taking? Authorizing Provider  cloNIDine (CATAPRES) 0.1 MG tablet Take 0.1 mg by mouth 2 (two) times daily.   Yes [provider]  desmopressin (DDAVP) 0.2 MG tablet Take 0.2 mg by mouth at bedtime.   Yes [provider]  lacosamide (VIMPAT) 200 MG TABS tablet Take 200 mg by mouth 2 (two) times daily.   Yes [provider]  lamoTRIgine (LAMICTAL) 200 MG tablet Take 200 mg by mouth 2 (two) times daily.   Yes [provider]  levETIRAcetam (KEPPRA) 1000 MG tablet Take 1,000 mg by mouth 2 (two) times daily.   Yes [provider]  LORazepam (ATIVAN) 0.5 MG tablet Take 0.5 mg by mouth every 8 (eight) hours.   Yes [provider]  lurasidone (LATUDA) 80 MG TABS tablet Take 80 mg by mouth every evening.   Yes [provider]  OLANZapine  (ZYPREXA) 10 MG tablet Take 10 mg by mouth in the morning and at bedtime.   Yes [provider]  prazosin (MINIPRESS) 1 MG capsule Take 1 mg by mouth at bedtime.   Yes [provider]  amoxicillin-clavulanate (AUGMENTIN) 875-125 MG tablet Take 1 tablet by mouth 2 (two) times daily. Patient not taking: Reported on 01/08/2020 07/06/19   Cathren Laine, MD    Allergies    Other  Review of Systems   Review of Systems  Constitutional: Negative for fatigue.  Respiratory: Negative for shortness of breath.   Gastrointestinal: Negative for abdominal pain.  Genitourinary: Negative for flank pain.  Skin: Positive for wound.  Neurological: Positive for seizures. Negative for weakness.  Psychiatric/Behavioral: Negative for confusion.    Physical Exam Updated Vital Signs BP 128/87   Pulse 80   Temp 98.2 F (36.8 C) (Oral)   Resp 19   SpO2 98%   Physical Exam Vitals and nursing note reviewed.  HENT:     Head: Normocephalic.     Comments: Approximate 4 to 5 cm check shaped laceration to left cheek.  Laceration over zygoma but somewhat difficult to determine underlying bony tenderness.  Eye movements intact. Eyes:     Pupils: Pupils are equal, round, and reactive to light.  Neck:     Comments: Painless motion.  No midline  tenderness. Cardiovascular:     Rate and Rhythm: Regular rhythm.  Pulmonary:     Breath sounds: Normal breath sounds.  Abdominal:     Tenderness: There is no abdominal tenderness.  Musculoskeletal:        General: No tenderness.     Cervical back: Neck supple. No tenderness.     Right lower leg: No edema.     Left lower leg: No edema.  Skin:    General: Skin is warm.  Neurological:     Mental Status: He is alert. Mental status is at baseline.     ED Results / Procedures / Treatments   Labs (all labs ordered are listed, but only abnormal results are displayed) Labs Reviewed  BASIC METABOLIC PANEL - Abnormal; Notable for the following  components:      Result Value   Glucose, Bld 151 (*)    All other components within normal limits  CBC    EKG None  Radiology CT Maxillofacial Wo Contrast  Result Date: 01/08/2020 CLINICAL DATA:  Seizures today. Fell and hit left side of face. EXAM: CT MAXILLOFACIAL WITHOUT CONTRAST TECHNIQUE: Multidetector CT imaging of the maxillofacial structures was performed. Multiplanar CT image reconstructions were also generated. COMPARISON:  None. FINDINGS: Osseous: No fracture or mandibular dislocation. No destructive process. Orbits: Negative. No traumatic or inflammatory finding. Sinuses: The paranasal sinuses and mastoid air cells are clear. Soft tissues: There is a hematoma and possible laceration involving the left cheek area. 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 IMPRESSION: No facial bone fractures are identified. Electronically Signed   By: Marijo Sanes M.D.   On: 01/08/2020 12:25    Procedures Procedures (including critical care time)  Medications Ordered in ED Medications  lidocaine-EPINEPHrine-tetracaine (LET) topical gel (3 mLs Topical Given 01/08/20 1128)  sodium bicarbonate (NEUT) 4 % injection 5 mL (5 mLs Subcutaneous Given by Other 01/08/20 1245)  lidocaine (XYLOCAINE) 2 % (with pres) injection 200 mg (200 mg Infiltration Given by Other 01/08/20 1245)    ED Course  I have reviewed the triage vital signs and the nursing notes.  Pertinent labs & imaging results that were available during my care of the patient were reviewed by me and considered in my medical decision making (see chart for details).    MDM Rules/Calculators/A&P                      Patient with seizure and fall.  Facial CT reassuring.  He is at his baseline now.  Does not appear to need head CT or other peripheral imaging.  C-spine clinically cleared.  Facial laceration closed by Irena Cords.  Follow-up with PCP as an outpatient.  Final Clinical Impression(s) / ED Diagnoses Final diagnoses:  Seizures ([place])  Face lacerations, initial encounter    Rx / DC Orders ED Discharge Orders    None       Davonna Belling, MD 01/08/20 [phone]

## 2020-01-08 NOTE — ED Triage Notes (Signed)
Pt from a group home.  Per caretaker in room, pt had a witnessed seizure this am, lasting 2-3 seconds, reports pt's typical seizures are "stare offs"  Pt was rinsing off in shower when he had his seizure, fell striking his cheek.  Pt in nad in triage  Pt with a laceration under Left eye

## 2020-01-08 NOTE — ED Notes (Signed)
Seizure pads put on bed rails, pt hooked up to monitor, and 12-lead was shot.

## 2020-01-14 DIAGNOSIS — Q031 Atresia of foramina of Magendie and Luschka: Secondary | ICD-10-CM | POA: Diagnosis not present

## 2020-01-18 DIAGNOSIS — S0181XD Laceration without foreign body of other part of head, subsequent encounter: Secondary | ICD-10-CM | POA: Diagnosis not present

## 2020-06-15 DIAGNOSIS — Q031 Atresia of foramina of Magendie and Luschka: Secondary | ICD-10-CM | POA: Diagnosis not present

## 2020-06-15 DIAGNOSIS — R7303 Prediabetes: Secondary | ICD-10-CM | POA: Diagnosis not present

## 2020-06-15 DIAGNOSIS — Z13220 Encounter for screening for lipoid disorders: Secondary | ICD-10-CM | POA: Diagnosis not present

## 2020-06-15 DIAGNOSIS — Z23 Encounter for immunization: Secondary | ICD-10-CM | POA: Diagnosis not present

## 2020-11-08 IMAGING — CT CT MAXILLOFACIAL W/O CM
3 series · 13 of 47 positions shown, 15 images · non-contrast
Comparison: 11/06/2009

CLINICAL DATA: Recent seizure activity with fall, initial encounter

EXAM:
CT HEAD WITHOUT CONTRAST
CT MAXILLOFACIAL WITHOUT CONTRAST
CT CERVICAL SPINE WITHOUT CONTRAST
TECHNIQUE: Multidetector CT imaging of the head, cervical spine, and
maxillofacial structures were performed using the standard protocol
without intravenous contrast. Multiplanar CT image reconstructions
of the cervical spine and maxillofacial structures were also
generated.

[Series 3: facialbone 2.0 st · axial · 0.39mm/px · z∈[-174,-30]mm · 7 of 88 slices shown, 9 images]
[im 10/88  brain]
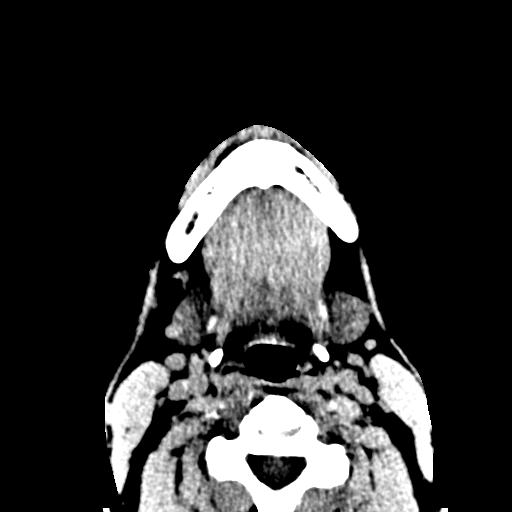
[im 10/88  bone]
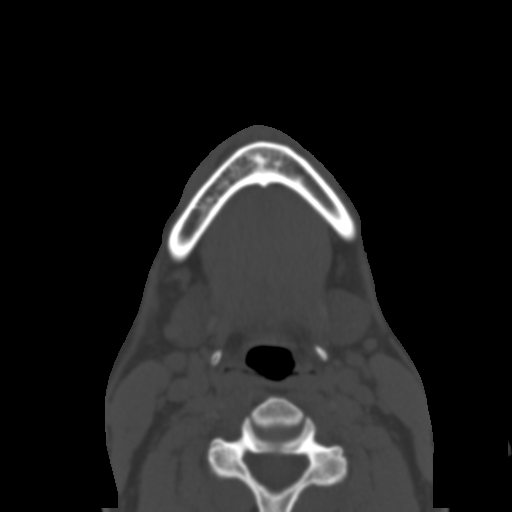
[im 22/88  bone]
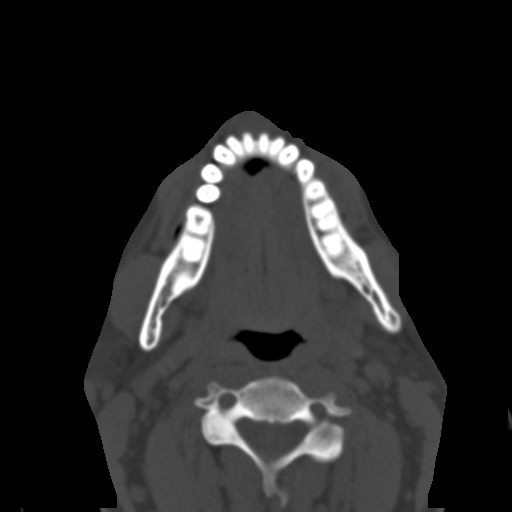
[im 34/88  bone]
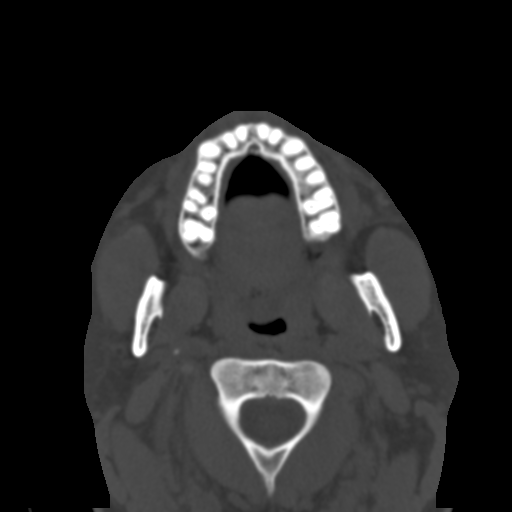
[im 46/88  bone]
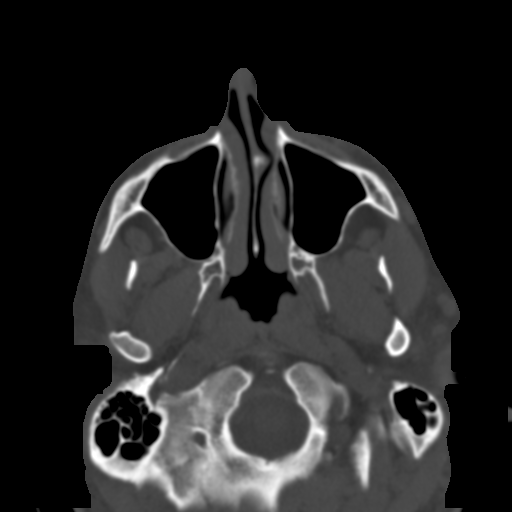
[im 58/88  brain]
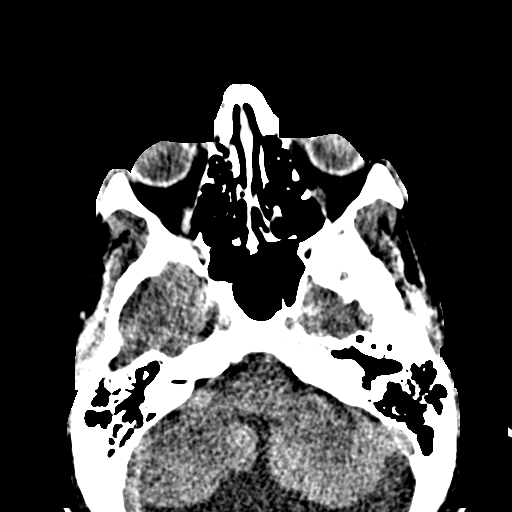
[im 58/88  bone]
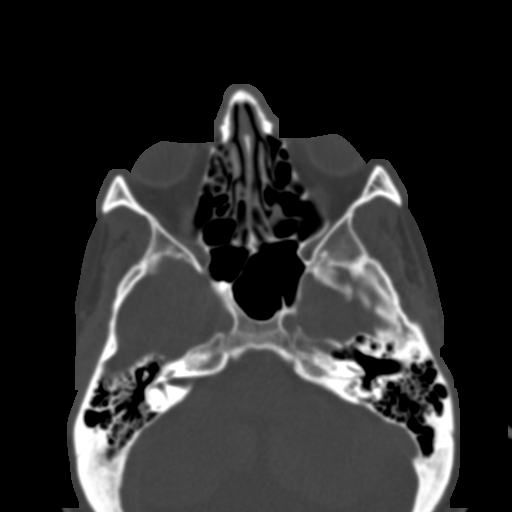
[im 70/88  bone]
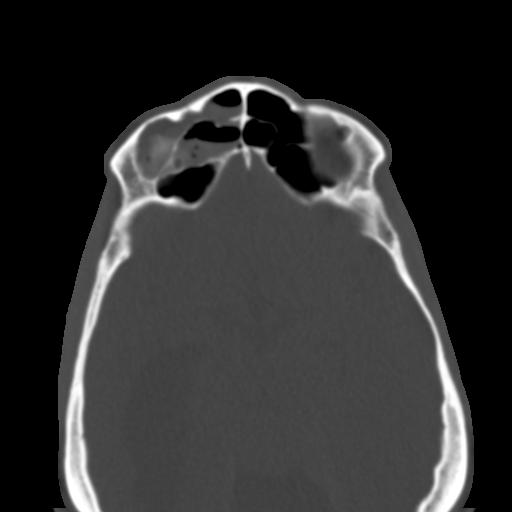
[im 82/88  bone]
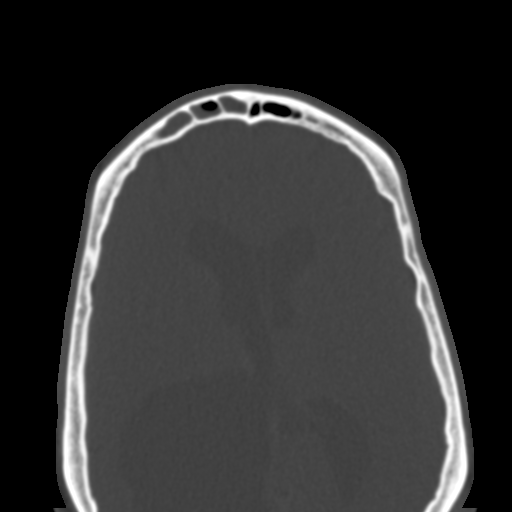

[Series 7: facialbone 2.0 cor st · coronal · 0.34mm/px · 3 of 125 slices shown]
[im 42/125  bone]
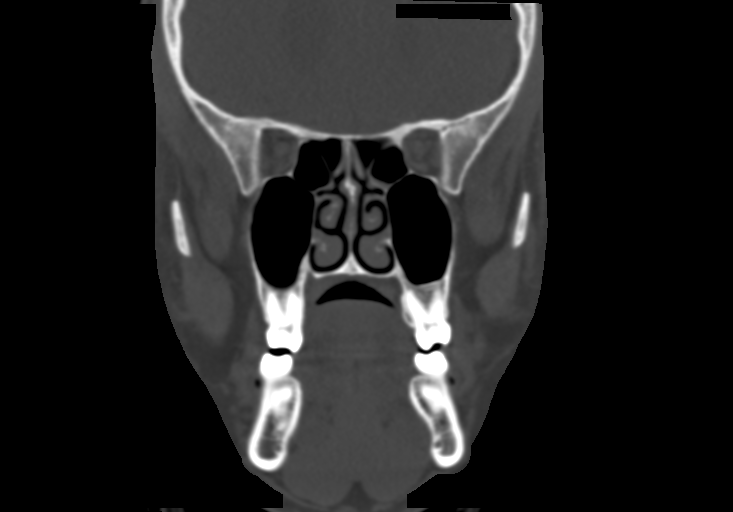
[im 56/125  bone]
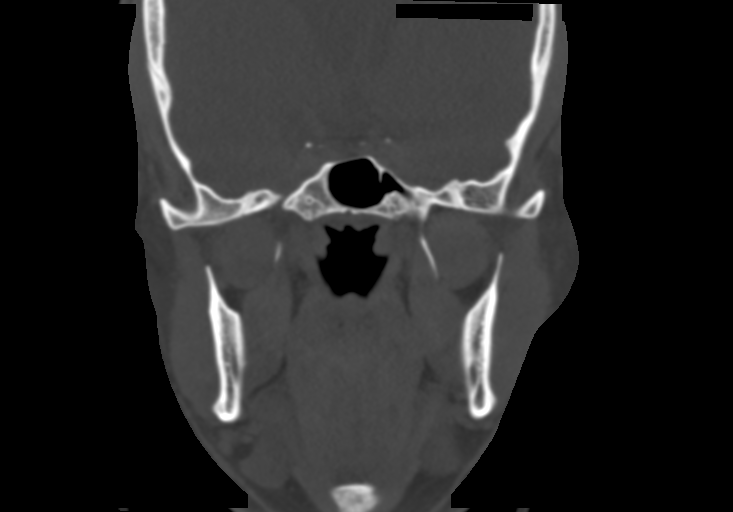
[im 69/125  bone]
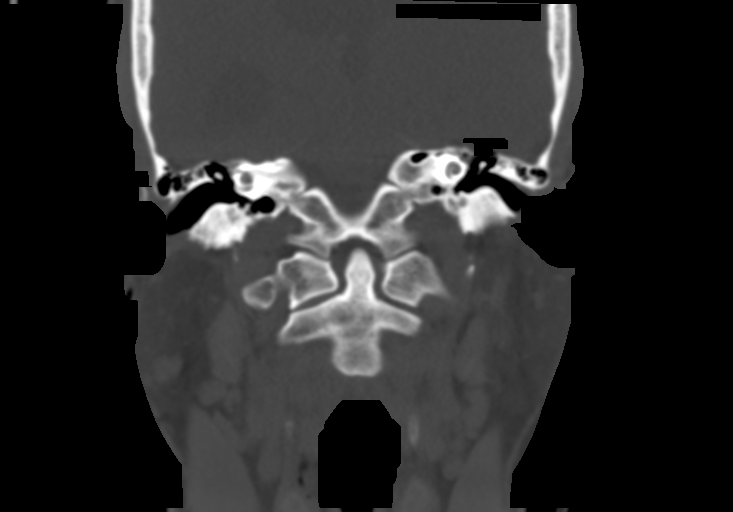

[Series 8: facialbone 2.0 sag st · sagittal · 0.34mm/px · 3 of 102 slices shown]
[im 34/102  bone]
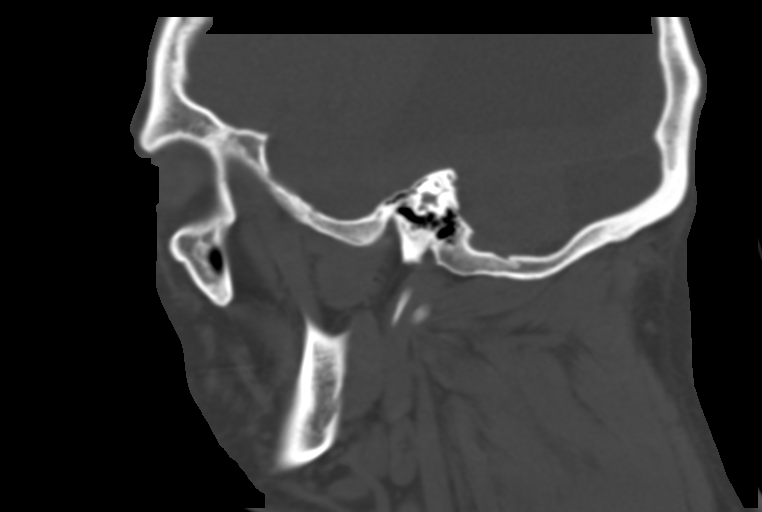
[im 51/102  bone]
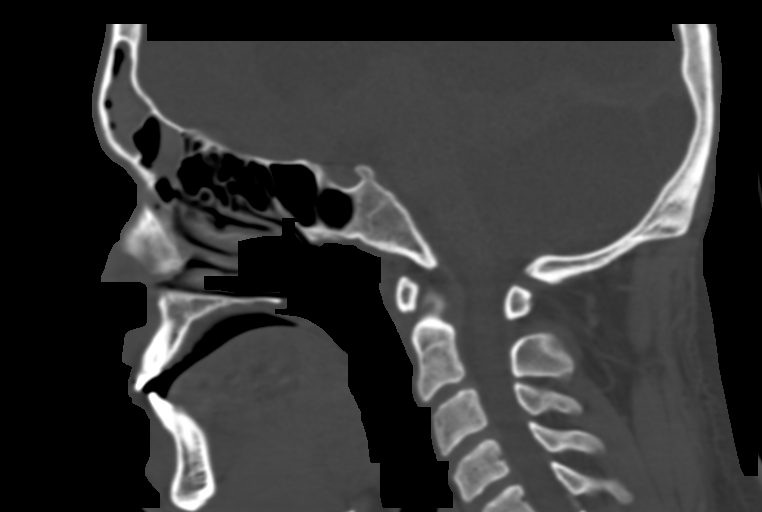
[im 68/102  bone]
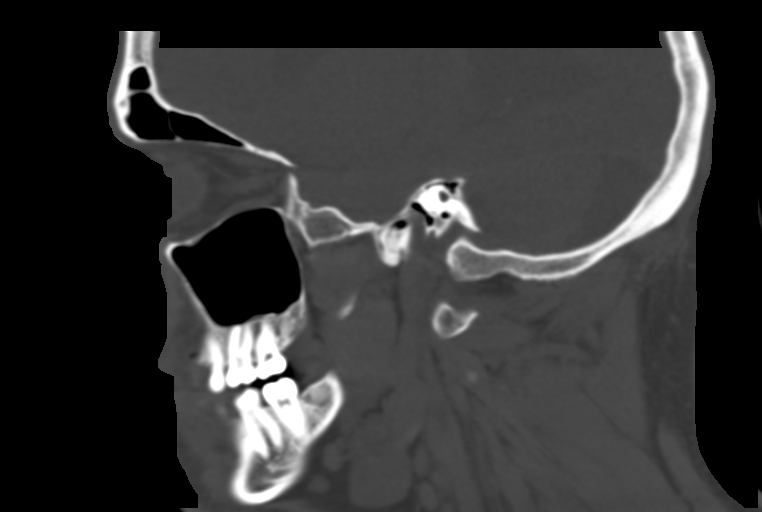

[13 of 47 positions shown; findings below may reference images not displayed]

FINDINGS: CT HEAD FINDINGS

Brain: Stable dilatation of the right lateral ventricle and the
region of the cisterna magna is noted with posterior defect in the
cerebellum. These changes are stable from the prior exam. No
findings to suggest acute hemorrhage, acute infarction or
space-occupying mass lesion noted.

Vascular: No hyperdense vessel or unexpected calcification.

Skull: There is a minimally depressed fracture involving the
anterior wall of the right frontal sinus in the area of clinical
concern. Opacification of the right frontal and ethmoid sinuses is
noted.

Other: Mild laceration in the right frontal region is noted
consistent with the recent injury.

CT MAXILLOFACIAL FINDINGS

Osseous: Previously described fracture in the anterior aspect of the
right frontal sinus is noted extending into the superior aspect of
the right orbital wall laterally. Minimal intraorbital air is noted.
No other fracture is noted.

Orbits: Orbits and their contents are otherwise within normal
limits.

Sinuses: Paranasal sinuses demonstrate opacification of the right
ethmoid and frontal sinuses related to the underlying fracture.
Ostiomeatal complexes are patent bilaterally.

Soft tissues: Mild soft tissue swelling is noted in the region of
the right frontal laceration. No other soft tissue abnormality is
noted.

CT CERVICAL SPINE FINDINGS

Alignment: Within normal limits.

Skull base and vertebrae: 7 cervical segments are well visualized.
Vertebral body height is well maintained. Mild osteophytic changes
are noted at C5-6 and C6-7. No acute fracture or acute facet
abnormality is noted.

Soft tissues and spinal canal: Surrounding soft tissue structures
are within normal limits.

Upper chest: Visualized lung apices are unremarkable.

Other: None
IMPRESSION: CT of the head: Chronic changes similar to that seen on the prior
exam.

No acute abnormality noted.

CT of the maxillofacial bones: Fracture in the anterior wall of the
right frontal sinus extending into the right ethmoid sinus and
superior wall of the right orbit. Minimal air within the right orbit
is

Noted. Fluid/blood is seen within the right frontal and ethmoid
sinuses related to the fracture.

CT of the cervical spine: Degenerative change without acute
abnormality.

## 2020-11-10 DIAGNOSIS — H5213 Myopia, bilateral: Secondary | ICD-10-CM | POA: Diagnosis not present

## 2020-12-16 DIAGNOSIS — E119 Type 2 diabetes mellitus without complications: Secondary | ICD-10-CM | POA: Diagnosis not present

## 2020-12-16 DIAGNOSIS — R7303 Prediabetes: Secondary | ICD-10-CM | POA: Diagnosis not present

## 2020-12-16 DIAGNOSIS — Q031 Atresia of foramina of Magendie and Luschka: Secondary | ICD-10-CM | POA: Diagnosis not present

## 2021-03-10 DIAGNOSIS — R7989 Other specified abnormal findings of blood chemistry: Secondary | ICD-10-CM | POA: Diagnosis not present
